# Patient Record
Sex: Male | Born: 1995 | Race: Black or African American | Hispanic: No | Marital: Single
Health system: Southern US, Community
[De-identification: ages and names within clinical notes are randomized; demographics above are authoritative.]

---

## 2017-09-15 ENCOUNTER — Emergency Department (HOSPITAL_COMMUNITY)
Admission: EM | Admit: 2017-09-15 | Discharge: 2017-09-16 | Disposition: A | Discharge status: Home / Self Care | Payer: Self-pay | Attending: Emergency Medicine | Admitting: Emergency Medicine

## 2017-09-15 ENCOUNTER — Emergency Department (HOSPITAL_COMMUNITY): Payer: Self-pay

## 2017-09-15 ENCOUNTER — Encounter (HOSPITAL_COMMUNITY): Payer: Self-pay | Admitting: Emergency Medicine

## 2017-09-15 DIAGNOSIS — S63280A Dislocation of proximal interphalangeal joint of right index finger, initial encounter: Secondary | ICD-10-CM | POA: Insufficient documentation

## 2017-09-15 DIAGNOSIS — Y929 Unspecified place or not applicable: Secondary | ICD-10-CM | POA: Insufficient documentation

## 2017-09-15 DIAGNOSIS — S63259A Unspecified dislocation of unspecified finger, initial encounter: Secondary | ICD-10-CM

## 2017-09-15 DIAGNOSIS — Y998 Other external cause status: Secondary | ICD-10-CM | POA: Insufficient documentation

## 2017-09-15 DIAGNOSIS — Y9367 Activity, basketball: Secondary | ICD-10-CM | POA: Insufficient documentation

## 2017-09-15 DIAGNOSIS — X58XXXA Exposure to other specified factors, initial encounter: Secondary | ICD-10-CM | POA: Insufficient documentation

## 2017-09-15 NOTE — ED Triage Notes (Signed)
R index finger dislocation at medial knuckle after playing basketball. CMS intact, pt states finger feels numb. No skin damage. Denies pain.

## 2017-09-16 ENCOUNTER — Emergency Department (HOSPITAL_COMMUNITY): Payer: Self-pay

## 2017-09-16 MED ORDER — IBUPROFEN 800 MG PO TABS: 800.0000 mg | ORAL_TABLET | Freq: Once | ORAL | Status: DC

## 2017-09-16 MED ORDER — BUPIVACAINE HCL (PF) 0.5 % IJ SOLN
30.0000 mL | Freq: Once | INTRAMUSCULAR | Status: DC
  Filled 2017-09-16: qty 30

## 2017-09-16 MED ORDER — IBUPROFEN 600 MG PO TABS: 600.0000 mg | ORAL_TABLET | Freq: Four times a day (QID) | ORAL | 0 refills | Status: AC | PRN

## 2017-09-16 NOTE — ED Notes (Signed)
To x-ray

## 2017-09-16 NOTE — Discharge Instructions (Signed)
As discussed, follow up with Dr Amanda Pea in office (hand specialist). Keep your finger splinted for the next 5 days or until your follow up appointment if sooner. Ibuprofen for pain and swelling. Ice as needed.  Return if you experience any worsening or new concerning symptoms prior to your follow up appointment. Numbness, color change, cold extremity etc.

## 2017-09-16 NOTE — Progress Notes (Signed)
Orthopedic Tech Progress Note Patient Details:  Gregory Weber 1996/01/07 [id number]  Ortho Devices Type of Ortho Device: Finger splint Ortho Device/Splint Location: rue index finger Ortho Device/Splint Interventions: Ordered, Application   Trinna Post 09/16/2017, 3:40 AM

## 2017-09-16 NOTE — ED Notes (Signed)
Delay explained to patient. Waiting for Ortho tech.

## 2017-09-16 NOTE — ED Notes (Signed)
Waiting for Ortho tech, who is at [facility]

## 2017-09-16 NOTE — ED Notes (Signed)
Patient tolerated procedure very well. X-ray pending then splint, then DC.

## 2017-09-16 NOTE — ED Provider Notes (Signed)
[facility]-EMERGENCY DEPT Provider Note   CSN: [id number] Arrival date & time: 09/15/17  2003     History   Chief Complaint Chief Complaint  Patient presents with  . Finger Injury    HPI Gregory Weber is a 21 y.o. male with no significant past medical history presenting with sudden onset right index injury and dislocation while playing basketball. Patient denies any other injuries or trauma. No numbness, weakness.  HPI  History reviewed. No pertinent past medical history.  There are no active problems to display for this patient.   No past surgical history on file.     Home Medications    Prior to Admission medications   Medication Sig Start Date End Date Taking? Authorizing Provider  ibuprofen (ADVIL,MOTRIN) 600 MG tablet Take 1 tablet (600 mg total) by mouth every 6 (six) hours as needed. 09/16/17   Georgiana Shore PA-C    Family History History reviewed. No pertinent family history.  Social History Social History  Substance Use Topics  . Smoking status: Not on file  . Smokeless tobacco: Not on file  . Alcohol use Not on file     Allergies   Patient has no known allergies.   Review of Systems Review of Systems  Gastrointestinal: Negative for nausea and vomiting.  Musculoskeletal: Positive for arthralgias. Negative for back pain, joint swelling, myalgias, neck pain and neck stiffness.  Skin: Negative for color change and pallor.  Neurological: Negative for weakness and numbness.     Physical Exam Updated Vital Signs BP (!) 140/91 (BP Location: Left Arm)   Pulse 78   Temp 99.1 F (37.3 C) (Oral)   Resp 16   Ht  (1.854 m)   Wt 83.9 kg (185 lb)   SpO2 99%   BMI 24.41 kg/m   Physical Exam  Constitutional: He appears well-developed and well-nourished. No distress.  Patient is afebrile, nontoxic appearing sitting comfortably in chair in no acute distress.  HENT:  Head: Normocephalic and atraumatic.  Eyes: Conjunctivae and EOM are normal.    Neck: Normal range of motion. Neck supple.  Cardiovascular: Normal rate, regular rhythm, normal heart sounds and intact distal pulses.   No murmur heard. Pulmonary/Chest: Effort normal and breath sounds normal. No respiratory distress. He has no wheezes. He has no rales.  Musculoskeletal: He exhibits tenderness and deformity. He exhibits no edema.  Right index PIP deformity with dislocation.  Neurological: He is alert. No sensory deficit.  Sensation intact, brisk cap refills, neurovascularly intact.  Skin: Skin is warm and dry. Capillary refill takes less than 2 seconds. He is not diaphoretic. No erythema. No pallor.  Psychiatric: He has a normal mood and affect.  Nursing note and vitals reviewed.    ED Treatments / Results  Labs (all labs ordered are listed, but only abnormal results are displayed) Labs Reviewed - No data to display  EKG  EKG Interpretation None       Radiology Dg Finger Index Right  Result Date: 09/16/2017 CLINICAL DATA:  Postreduction PIP joint of right index finger. EXAM: RIGHT INDEX FINGER 2+V COMPARISON:  09/15/2017 FINDINGS: Normal anatomic position of the proximal interphalangeal joint of the right second finger postreduction. No acute fractures are identified. Mild soft tissue swelling. IMPRESSION: Successful reduction of right proximal interphalangeal joint. Electronically Signed   By: Burman Nieves M.D.   On: 09/16/2017 01:51   Dg Finger Index Right  Result Date: 09/15/2017 CLINICAL DATA:  Deformity after playing basketball EXAM: RIGHT INDEX FINGER 2+V  COMPARISON:  None. FINDINGS: Dorsal dislocation of the second middle phalanx with respect to the proximal phalanx. Slight ulnar subluxation as well. No obvious fracture. IMPRESSION: Dorsal dislocation at the second PIP joint. Electronically Signed   By: Jasmine Pang M.D.   On: 09/15/2017 22:08    Procedures .Nerve Block Date/Time: 09/16/2017 12:59 AM Performed by: Mathews Robinsons B Authorized  by: Mathews Robinsons B   Consent:    Consent obtained:  Verbal   Consent given by:  Patient   Risks discussed:  Pain and nerve damage   Alternatives discussed:  No treatment Indications:    Indications:  Pain relief and procedural anesthesia Location:    Body area:  Upper extremity   Laterality:  Right Pre-procedure details:    Skin preparation:  2% chlorhexidine Skin anesthesia (see MAR for exact dosages):    Skin anesthesia method:  None Procedure details (see MAR for exact dosages):    Block needle gauge:  25 G   Anesthetic injected:  Bupivacaine 0.5% w/o epi   Steroid injected:  None   Additive injected:  None   Injection procedure:  Anatomic landmarks identified   Paresthesia:  None Post-procedure details:    Dressing:  None   Outcome:  Anesthesia achieved   Patient tolerance of procedure:  Tolerated well, no immediate complications Reduction of dislocation Date/Time: 09/16/2017 1:05 AM Performed by: Mathews Robinsons B Authorized by: Mathews Robinsons B  Consent: Verbal consent obtained. Written consent obtained. Risks and benefits: risks, benefits and alternatives were discussed Consent given by: patient Patient understanding: patient states understanding of the procedure being performed Patient consent: the patient's understanding of the procedure matches consent given Procedure consent: procedure consent matches procedure scheduled Relevant documents: relevant documents present and verified Test results: test results available and properly labeled Site marked: the operative site was marked Imaging studies: imaging studies available Required items: required blood products, implants, devices, and special equipment available Patient identity confirmed: verbally with patient Time out: Immediately prior to procedure a "time out" was called to verify the correct patient, procedure, equipment, support staff and site/side marked as required. Local anesthesia used:  yes Anesthesia: digital block  Anesthesia: Local anesthesia used: yes Local Anesthetic: bupivacaine 0.5% without epinephrine Anesthetic total: 3 mL  Sedation: Patient sedated: no Patient tolerance: Patient tolerated the procedure well with no immediate complications    SPLINT APPLICATION Date/Time: 1:54 AM Authorized by: Georgiana Shore Consent: Verbal consent obtained. Risks and benefits: risks, benefits and alternatives were discussed Consent given by: patient Splint applied by: orthopedic technician Location details: right index Splint type: finger static Supplies used: finger splint Post-procedure: The splinted body part was neurovascularly unchanged following the procedure. Patient tolerance: Patient tolerated the procedure well with no immediate complications.   (including critical care time)  Medications Ordered in ED Medications  bupivacaine (MARCAINE) 0.5 % injection 30 mL (not administered)  ibuprofen (ADVIL,MOTRIN) tablet 800 mg (not administered)     Initial Impression / Assessment and Plan / ED Course  I have reviewed the triage vital signs and the nursing notes.  Pertinent labs & imaging results that were available during my care of the patient were reviewed by me and considered in my medical decision making (see chart for details).    Patient presenting with dorsal displacement of the right index PIP after injury sustained while playing basketball. Confirmed dislocation without fracture on plain films.  Joint reduced successfully Confirmed by xray  Patient significantly improved. Will discharge home with close follow-up with  hand and splint. Rice protocol indicated and discussed with patient. Ibuprofen for pain and swelling.  Discussed strict return precautions and advised to return to the emergency department if experiencing any new or worsening symptoms. Instructions were understood and patient agreed with discharge plan.  Final Clinical  Impressions(s) / ED Diagnoses   Final diagnoses:  Dislocation of finger, initial encounter    New Prescriptions New Prescriptions   IBUPROFEN (ADVIL,MOTRIN) 600 MG TABLET    Take 1 tablet (600 mg total) by mouth every 6 (six) hours as needed.     Georgiana Shore, PA-C 09/16/17 [id number]    Marily Memos, MD 09/17/17 [phone]

## 2017-09-16 NOTE — ED Notes (Signed)
Patient ready for DC. Finger without pain at this time.

## 2018-06-04 IMAGING — DX DG FINGER INDEX 2+V*R*
3 series · 3 of 3 positions shown · non-contrast
Comparison: None.

CLINICAL DATA: Deformity after playing basketball

EXAM:
RIGHT INDEX FINGER 2+V

[finger ap]
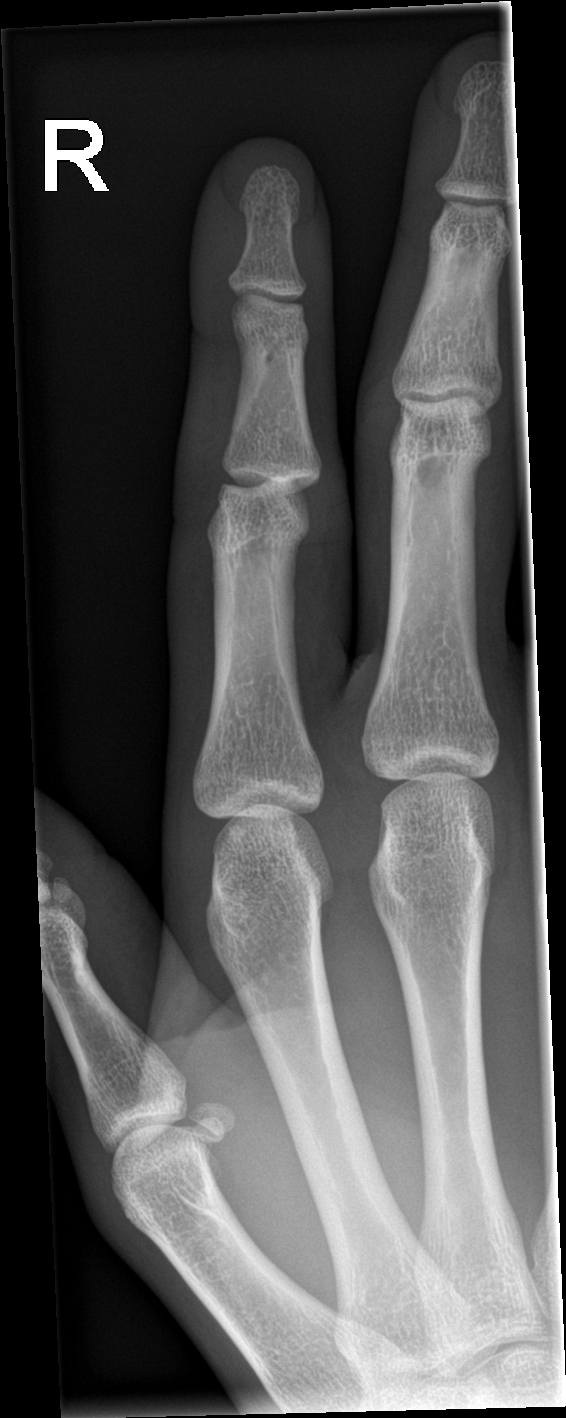

[finger obl]
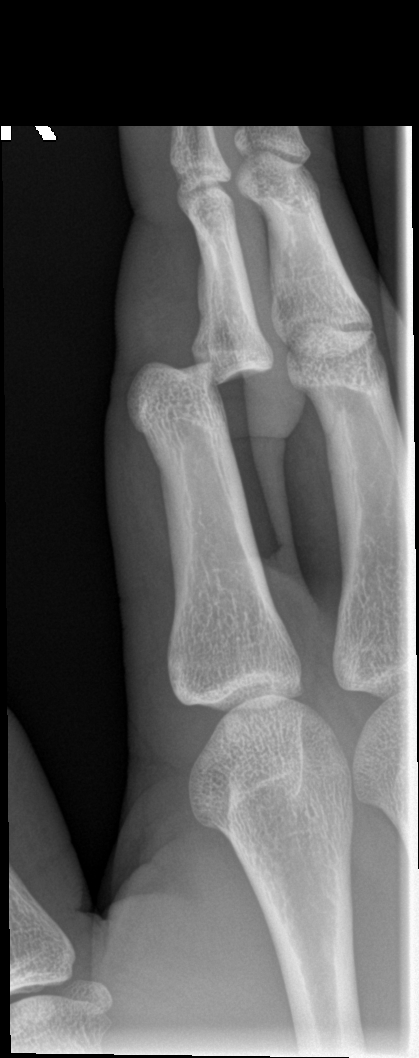

[finger lat]
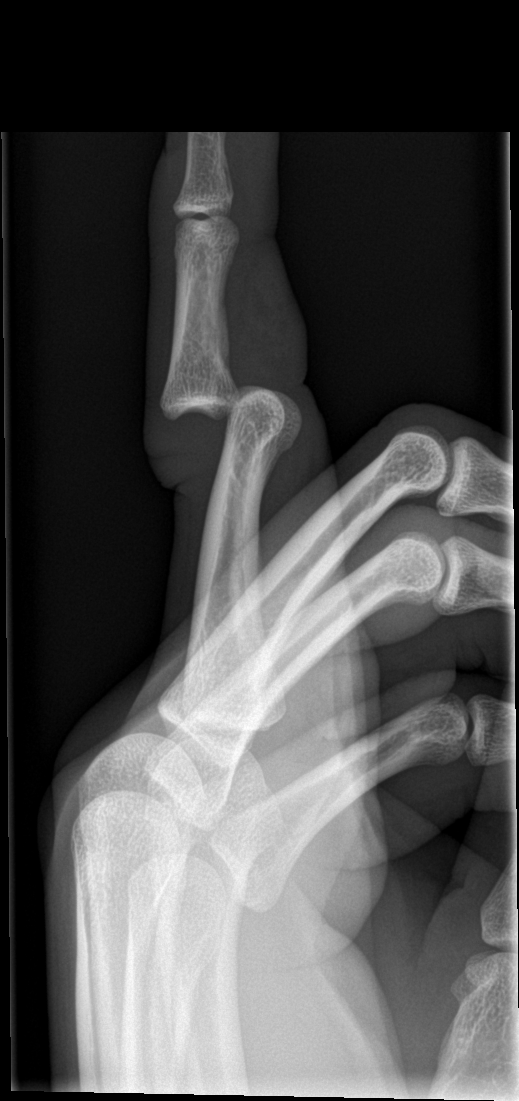

[3 of 3 positions shown; findings below may reference images not displayed]

FINDINGS: Dorsal dislocation of the second middle phalanx with respect to the
proximal phalanx. Slight ulnar subluxation as well. No obvious
fracture.
IMPRESSION: Dorsal dislocation at the second PIP joint.

## 2018-06-05 IMAGING — DX DG FINGER INDEX 2+V*R*
3 series · 3 of 3 positions shown · non-contrast
Comparison: 09/15/2017

CLINICAL DATA: Postreduction PIP joint of right index finger.

EXAM:
RIGHT INDEX FINGER 2+V

[finger ap]
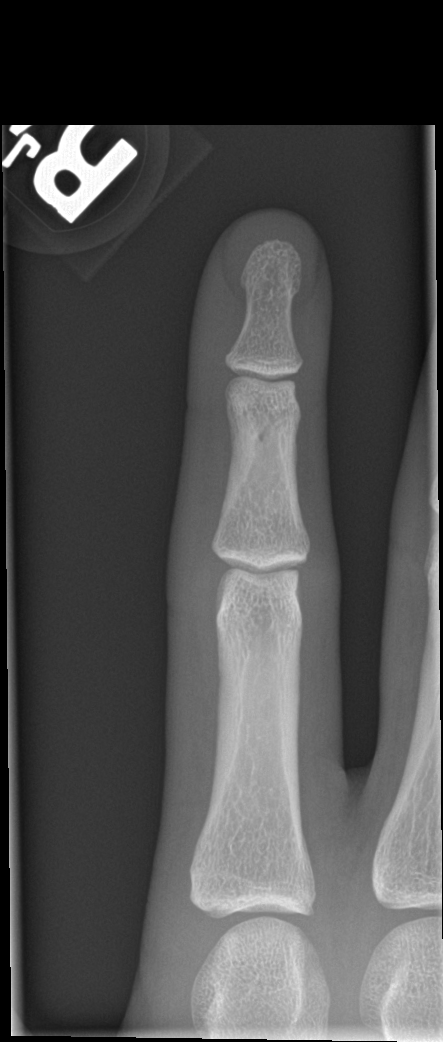

[finger obl]
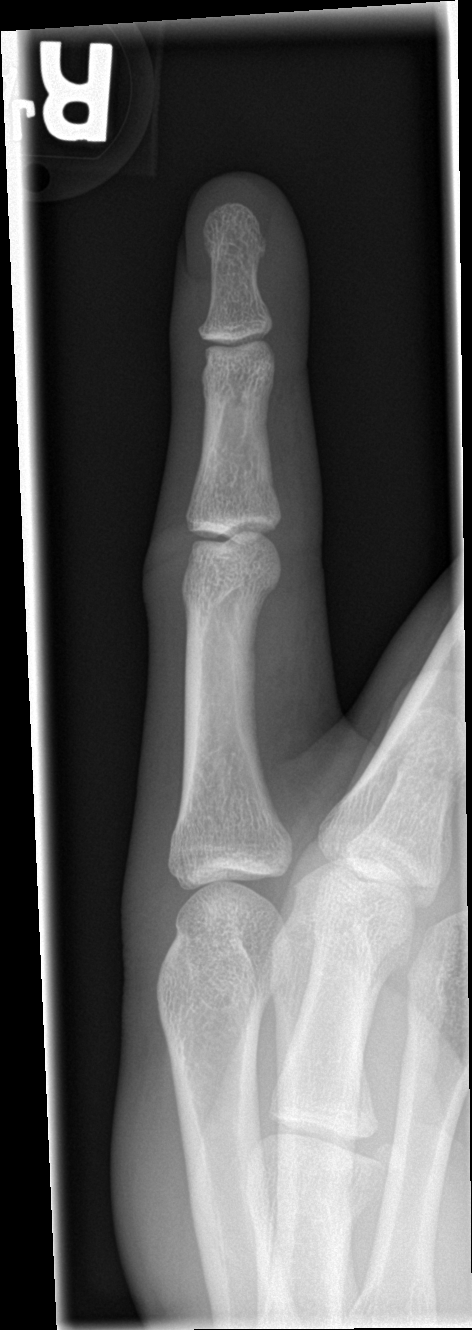

[finger lat]
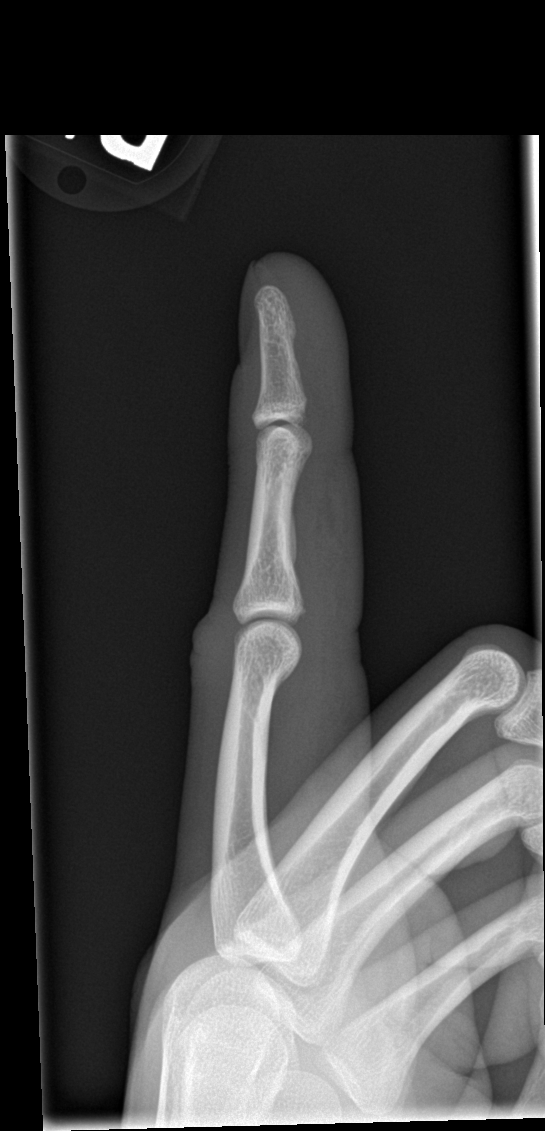

[3 of 3 positions shown; findings below may reference images not displayed]

FINDINGS: Normal anatomic position of the proximal interphalangeal joint of
the right second finger postreduction. No acute fractures are
identified. Mild soft tissue swelling.
IMPRESSION: Successful reduction of right proximal interphalangeal joint.
